# Patient Record
Sex: Female | Born: 1995 | Race: White | Hispanic: No | Marital: Single | Smoking: Never smoker
Health system: Southern US, Community
[De-identification: ages and names within clinical notes are randomized; demographics above are authoritative.]

## PROBLEM LIST (undated history)

## (undated) HISTORY — PX: ADENOIDECTOMY: SUR15

---

## 2018-05-16 ENCOUNTER — Ambulatory Visit (HOSPITAL_BASED_OUTPATIENT_CLINIC_OR_DEPARTMENT_OTHER)
Admit: 2018-05-16 | Discharge: 2018-05-16 | Disposition: A | Discharge status: Home / Self Care | Payer: Federal, State, Local not specified - PPO | Attending: Emergency Medicine | Admitting: Emergency Medicine

## 2018-05-16 ENCOUNTER — Encounter (HOSPITAL_BASED_OUTPATIENT_CLINIC_OR_DEPARTMENT_OTHER): Payer: Self-pay | Admitting: Emergency Medicine

## 2018-05-16 ENCOUNTER — Other Ambulatory Visit: Payer: Self-pay

## 2018-05-16 ENCOUNTER — Emergency Department (HOSPITAL_BASED_OUTPATIENT_CLINIC_OR_DEPARTMENT_OTHER)
Admission: EM | Admit: 2018-05-16 | Discharge: 2018-05-16 | Disposition: A | Discharge status: Home / Self Care | Payer: Federal, State, Local not specified - PPO | Attending: Emergency Medicine | Admitting: Emergency Medicine

## 2018-05-16 DIAGNOSIS — Z79899 Other long term (current) drug therapy: Secondary | ICD-10-CM | POA: Diagnosis not present

## 2018-05-16 DIAGNOSIS — R110 Nausea: Secondary | ICD-10-CM | POA: Diagnosis not present

## 2018-05-16 DIAGNOSIS — R1011 Right upper quadrant pain: Secondary | ICD-10-CM

## 2018-05-16 LAB — CBC WITH DIFFERENTIAL/PLATELET
Abs Immature Granulocytes: 0.01 10*3/uL (ref 0.00–0.07)
BASOS PCT: 1 %
Basophils Absolute: 0.1 10*3/uL (ref 0.0–0.1)
Eosinophils Absolute: 0.1 10*3/uL (ref 0.0–0.5)
Eosinophils Relative: 1 %
HCT: 43.4 % (ref 36.0–46.0)
Hemoglobin: 14.3 g/dL (ref 12.0–15.0)
Immature Granulocytes: 0 %
Lymphocytes Relative: 43 %
Lymphs Abs: 3.2 10*3/uL (ref 0.7–4.0)
MCH: 29.8 pg (ref 26.0–34.0)
MCHC: 32.9 g/dL (ref 30.0–36.0)
MCV: 90.4 fL (ref 80.0–100.0)
Monocytes Absolute: 0.7 10*3/uL (ref 0.1–1.0)
Monocytes Relative: 9 %
Neutro Abs: 3.5 10*3/uL (ref 1.7–7.7)
Neutrophils Relative %: 46 %
Platelets: 290 10*3/uL (ref 150–400)
RBC: 4.8 MIL/uL (ref 3.87–5.11)
RDW: 11.5 % (ref 11.5–15.5)
WBC: 7.4 10*3/uL (ref 4.0–10.5)
nRBC: 0 % (ref 0.0–0.2)

## 2018-05-16 LAB — COMPREHENSIVE METABOLIC PANEL
ALT: 14 U/L (ref 0–44)
AST: 17 U/L (ref 15–41)
Albumin: 4.7 g/dL (ref 3.5–5.0)
Alkaline Phosphatase: 48 U/L (ref 38–126)
Anion gap: 5 (ref 5–15)
BUN: 10 mg/dL (ref 6–20)
CO2: 26 mmol/L (ref 22–32)
Calcium: 9.2 mg/dL (ref 8.9–10.3)
Chloride: 106 mmol/L (ref 98–111)
Creatinine, Ser: 0.67 mg/dL (ref 0.44–1.00)
GFR calc Af Amer: >60 mL/min (ref >60)
GFR calc non Af Amer: >60 mL/min (ref >60)
Glucose, Bld: 99 mg/dL (ref 70–99)
POTASSIUM: 3.2 mmol/L — AB (ref 3.5–5.1)
Sodium: 137 mmol/L (ref 135–145)
Total Bilirubin: 0.6 mg/dL (ref 0.3–1.2)
Total Protein: 7.5 g/dL (ref 6.5–8.1)

## 2018-05-16 LAB — URINALYSIS, ROUTINE W REFLEX MICROSCOPIC
Bilirubin Urine: NEGATIVE
Glucose, UA: NEGATIVE mg/dL
Hgb urine dipstick: NEGATIVE
Ketones, ur: NEGATIVE mg/dL
Leukocytes,Ua: NEGATIVE
Nitrite: NEGATIVE
PROTEIN: NEGATIVE mg/dL
Specific Gravity, Urine: 1.025 (ref 1.005–1.030)
pH: 6 (ref 5.0–8.0)

## 2018-05-16 LAB — LIPASE, BLOOD: Lipase: 39 U/L (ref 11–51)

## 2018-05-16 LAB — PREGNANCY, URINE: PREG TEST UR: NEGATIVE

## 2018-05-16 MED ORDER — FENTANYL CITRATE (PF) 100 MCG/2ML IJ SOLN
100.0000 ug | Freq: Once | INTRAMUSCULAR | Status: AC
  Administered 2018-05-16: 100 ug via INTRAVENOUS
  Filled 2018-05-16: qty 2

## 2018-05-16 MED ORDER — ONDANSETRON HCL 4 MG/2ML IJ SOLN
4.0000 mg | Freq: Once | INTRAMUSCULAR | Status: AC
  Administered 2018-05-16: 4 mg via INTRAVENOUS
  Filled 2018-05-16: qty 2

## 2018-05-16 NOTE — ED Provider Notes (Signed)
[facility] Provider Note: Lowella Dell, MD, FACEP  CSN: [id number] MRN: [id number] ARRIVAL: 05/16/18 at 0144 ROOM: [facility]07   CHIEF COMPLAINT  Abdominal Pain   HISTORY OF PRESENT ILLNESS  05/16/18 1:57 AM Lauren Schmitt is a 23 y.o. female who had a sudden onset of sharp, severe right upper quadrant pain about 30 minutes prior to arrival.  The pain is worse with movement or palpation.  There is associated nausea but no vomiting or diarrhea.  She denies fever.  She has no history of similar pain.    History reviewed. No pertinent past medical history.  Past Surgical History:  Procedure Laterality Date  . ADENOIDECTOMY      Family History  Problem Relation Age of Onset  . Stroke Other     Social History   Tobacco Use  . Smoking status: Never Smoker  . Smokeless tobacco: Never Used  Substance Use Topics  . Alcohol use: Yes    Comment: rare  . Drug use: Never    Prior to Admission medications   Medication Sig Start Date End Date Taking? Authorizing Provider  levonorgestrel (MIRENA) 20 MCG/24HR IUD Mirena 20 mcg/24 hours (5 yrs) 52 mg intrauterine device  Take 1 device by intrauterine route.    [provider]    Allergies Sulfa antibiotics   REVIEW OF SYSTEMS  Negative except as noted here or in the History of Present Illness.   PHYSICAL EXAMINATION  Initial Vital Signs Blood pressure (!) 119/105, pulse (!) 103, temperature 97.9 F (36.6 C), temperature source Oral, height 5' (1.524 m), weight 49.9 kg, last menstrual period 05/16/2018, SpO2 100 %.  Examination General: Well-developed, well-nourished female in no acute distress; appearance consistent with age of record HENT: normocephalic; atraumatic Eyes: pupils equal, round and reactive to light; extraocular muscles intact Neck: supple Heart: regular rate and rhythm Lungs: clear to auscultation bilaterally Abdomen: soft; nondistended; right upper quadrant tenderness; bowel  sounds present Extremities: No deformity; full range of motion; pulses normal Neurologic: Awake, alert and oriented; motor function intact in all extremities and symmetric; no facial droop Skin: Warm and dry Psychiatric: Tearful   RESULTS  Summary of this visit's results, reviewed by myself:   EKG Interpretation  Date/Time:    Ventricular Rate:    PR Interval:    QRS Duration:   QT Interval:    QTC Calculation:   R Axis:     Text Interpretation:        Laboratory Studies: Results for orders placed or performed during the hospital encounter of 05/16/18 (from the past 24 hour(s))  Lipase, blood     Status: None   Collection Time: 05/16/18  2:09 AM  Result Value Ref Range   Lipase 39 11 - 51 U/L  Comprehensive metabolic panel     Status: Abnormal   Collection Time: 05/16/18  2:09 AM  Result Value Ref Range   Sodium 137 135 - 145 mmol/L   Potassium 3.2 (L) 3.5 - 5.1 mmol/L   Chloride 106 98 - 111 mmol/L   CO2 26 22 - 32 mmol/L   Glucose, Bld 99 70 - 99 mg/dL   BUN 10 6 - 20 mg/dL   Creatinine, Ser 0.16 0.44 - 1.00 mg/dL   Calcium 9.2 8.9 - 01.0 mg/dL   Total Protein 7.5 6.5 - 8.1 g/dL   Albumin 4.7 3.5 - 5.0 g/dL   AST 17 15 - 41 U/L   ALT 14 0 - 44 U/L   Alkaline  Phosphatase 48 38 - 126 U/L   Total Bilirubin 0.6 0.3 - 1.2 mg/dL   GFR calc non Af Amer >60 >60 mL/min   GFR calc Af Amer >60 >60 mL/min   Anion gap 5 5 - 15  CBC with Differential/Platelet     Status: None   Collection Time: 05/16/18  2:09 AM  Result Value Ref Range   WBC 7.4 4.0 - 10.5 K/uL   RBC 4.80 3.87 - 5.11 MIL/uL   Hemoglobin 14.3 12.0 - 15.0 g/dL   HCT 27.0 35.0 - 09.3 %   MCV 90.4 80.0 - 100.0 fL   MCH 29.8 26.0 - 34.0 pg   MCHC 32.9 30.0 - 36.0 g/dL   RDW 81.8 29.9 - 37.1 %   Platelets 290 150 - 400 K/uL   nRBC 0.0 0.0 - 0.2 %   Neutrophils Relative % 46 %   Neutro Abs 3.5 1.7 - 7.7 K/uL   Lymphocytes Relative 43 %   Lymphs Abs 3.2 0.7 - 4.0 K/uL   Monocytes Relative 9 %    Monocytes Absolute 0.7 0.1 - 1.0 K/uL   Eosinophils Relative 1 %   Eosinophils Absolute 0.1 0.0 - 0.5 K/uL   Basophils Relative 1 %   Basophils Absolute 0.1 0.0 - 0.1 K/uL   Immature Granulocytes 0 %   Abs Immature Granulocytes 0.01 0.00 - 0.07 K/uL  Urinalysis, Routine w reflex microscopic     Status: None   Collection Time: 05/16/18  3:29 AM  Result Value Ref Range   Color, Urine YELLOW YELLOW   APPearance CLEAR CLEAR   Specific Gravity, Urine 1.025 1.005 - 1.030   pH 6.0 5.0 - 8.0   Glucose, UA NEGATIVE NEGATIVE mg/dL   Hgb urine dipstick NEGATIVE NEGATIVE   Bilirubin Urine NEGATIVE NEGATIVE   Ketones, ur NEGATIVE NEGATIVE mg/dL   Protein, ur NEGATIVE NEGATIVE mg/dL   Nitrite NEGATIVE NEGATIVE   Leukocytes,Ua NEGATIVE NEGATIVE  Pregnancy, urine     Status: None   Collection Time: 05/16/18  3:29 AM  Result Value Ref Range   Preg Test, Ur NEGATIVE NEGATIVE   Imaging Studies: No results found.  ED COURSE and MDM  Nursing notes and initial vitals signs, including pulse oximetry, reviewed.  Vitals:   05/16/18 0151 05/16/18 0154  BP:  (!) 119/105  Pulse:  (!) 103  Temp:  97.9 F (36.6 C)  TempSrc:  Oral  SpO2:  100%  Weight: 49.9 kg   Height: 5' (1.524 m)    3:44 AM Patient pain-free at this time, abdomen soft and nontender.  Symptoms are suspicious for biliary colic.  I was unable to see the gallbladder with bedside ultrasound.  Will have her return later this morning for a formal ultrasound.  PROCEDURES    ED DIAGNOSES     ICD-10-CM   1. Right upper quadrant abdominal pain R10.11        Amorette Charrette, Jonny Ruiz, MD 05/16/18 [phone]

## 2018-05-16 NOTE — ED Triage Notes (Signed)
Pt states she was watching TV tonight and got a sharp sudden pain in her right side  Pt states the pain has gotten worse since and is now on the right side and moving toward the center   Denies N/V/D

## 2018-05-16 NOTE — ED Provider Notes (Signed)
Patient is feeling well.  She has minimal to no abdominal pain.  Informed of negative ultrasound results.   Pricilla Loveless, MD 05/16/18 [phone]

## 2020-08-10 IMAGING — US US ABDOMEN LIMITED
1 series · 14 of 25 positions shown · non-contrast
Comparison: None.

CLINICAL DATA: Right upper quadrant pain and nausea for 1 day

EXAM:
ULTRASOUND ABDOMEN LIMITED RIGHT UPPER QUADRANT

[Series 1: us abdomen limited · 0.14mm/px · 14 of 30 slices shown]
[im 1/30]
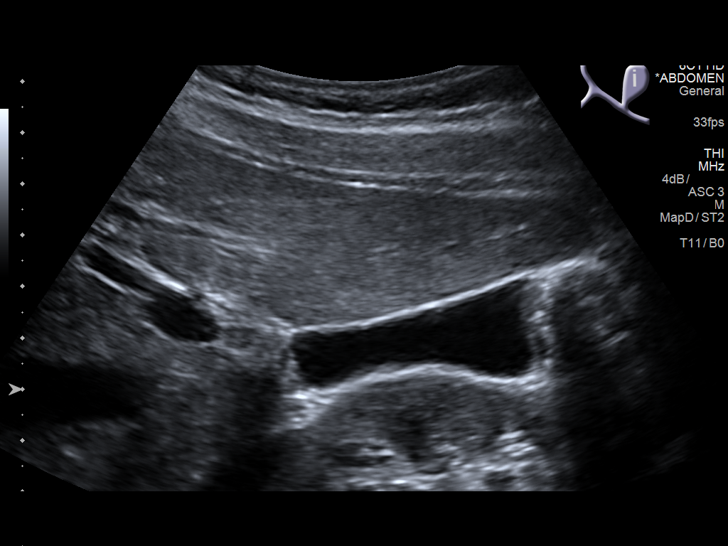
[im 3/30]
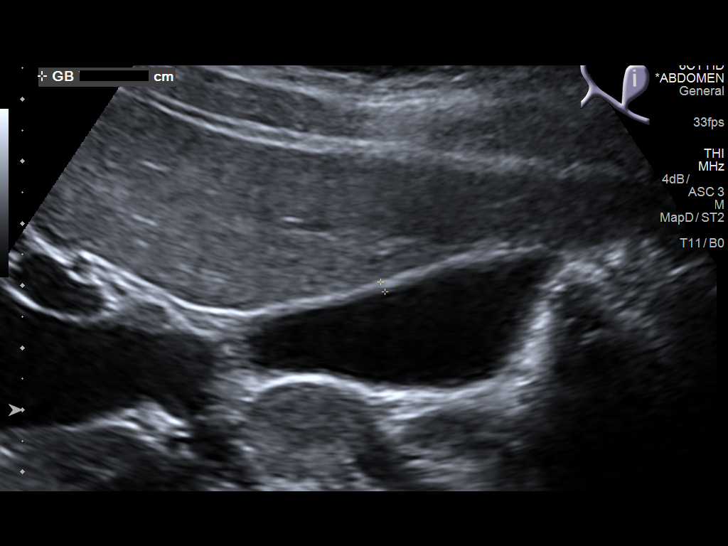
[im 5/30]
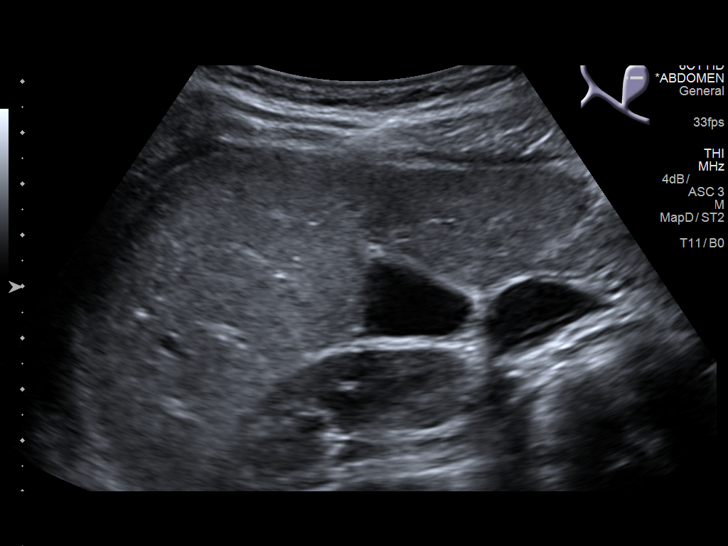
[im 8/30]
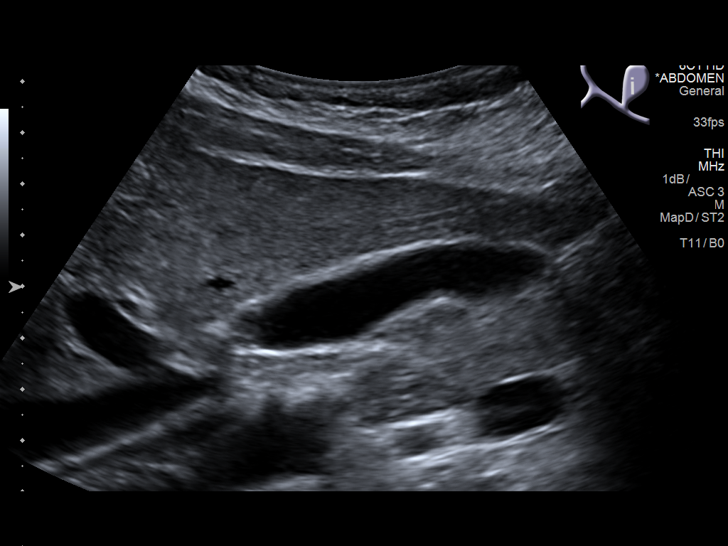
[im 10/30]
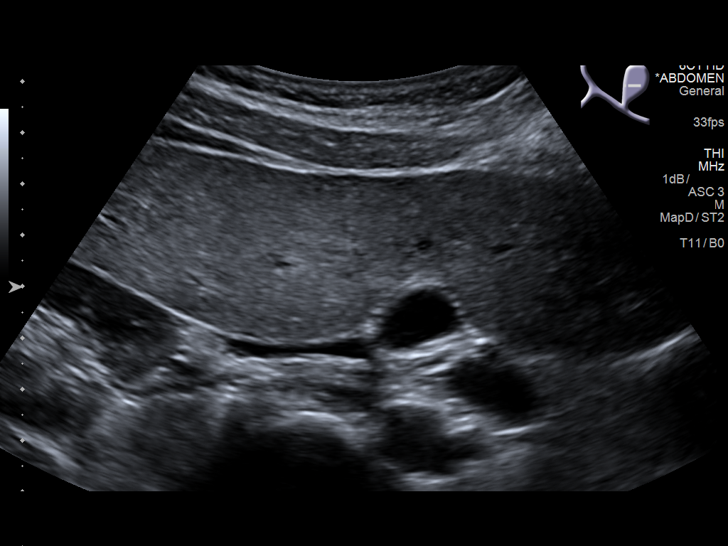
[im 11/30]
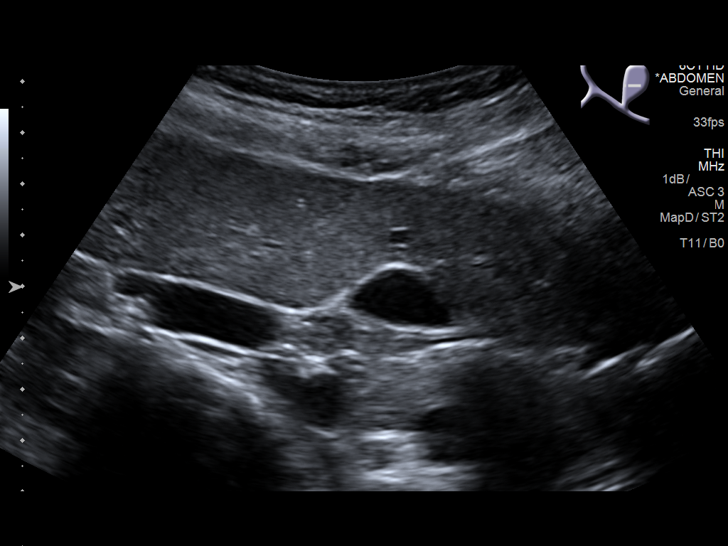
[im 14/30]
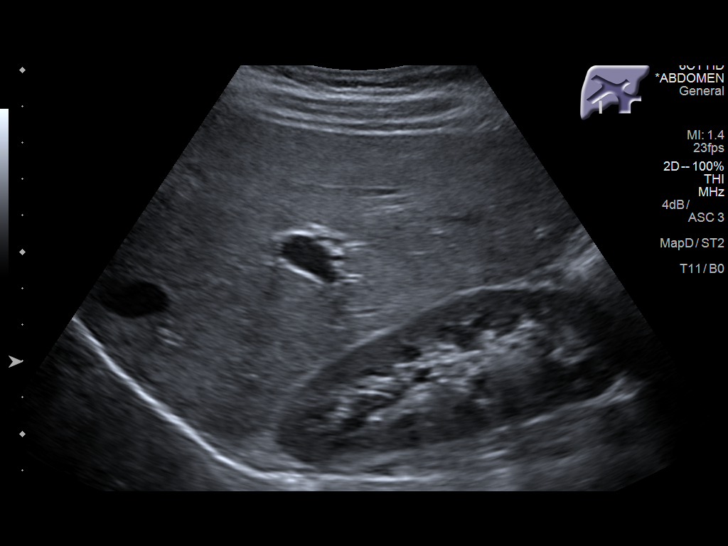
[im 16/30]
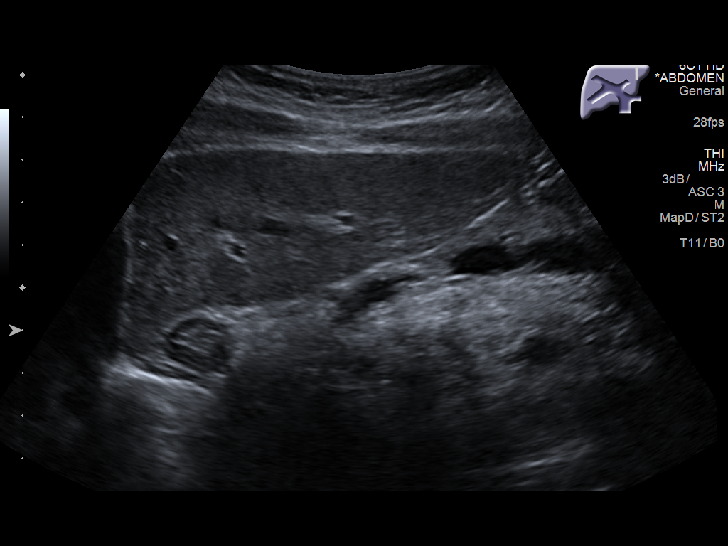
[im 19/30]
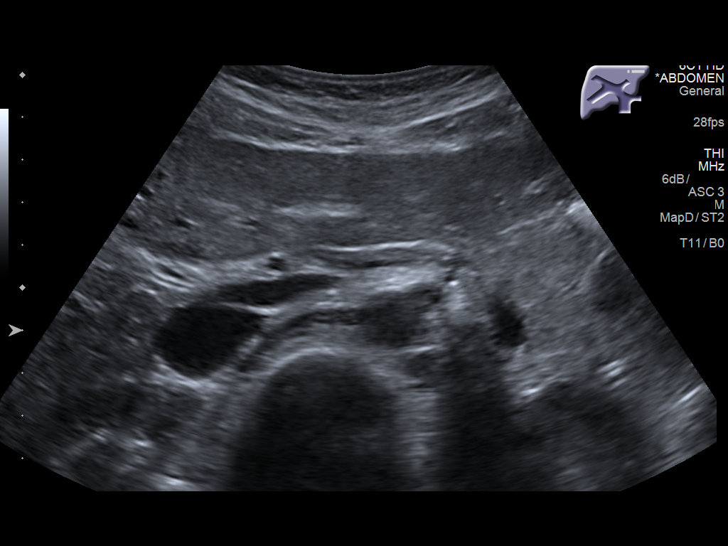
[im 20/30]
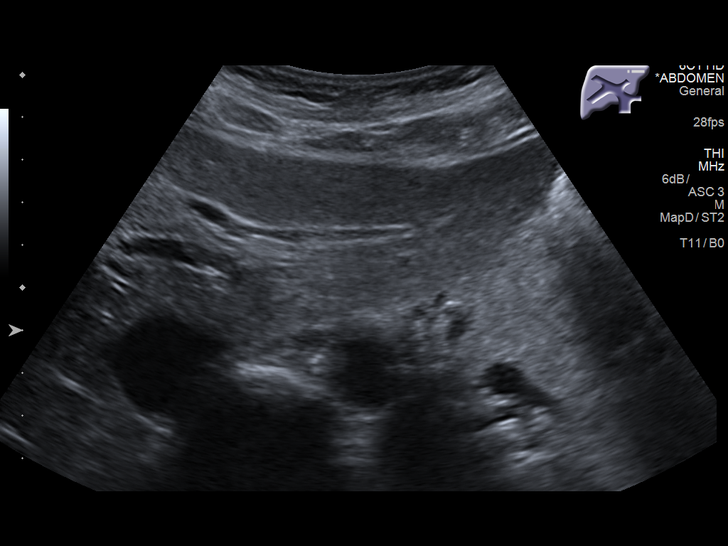
[im 22/30]
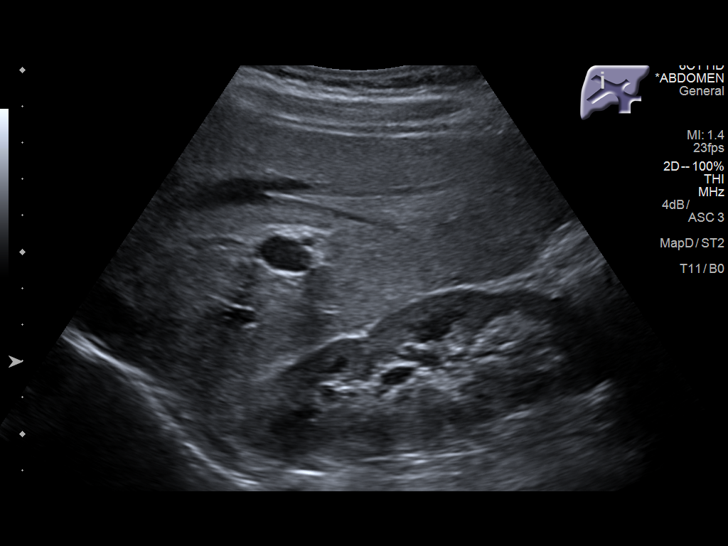
[im 25/30]
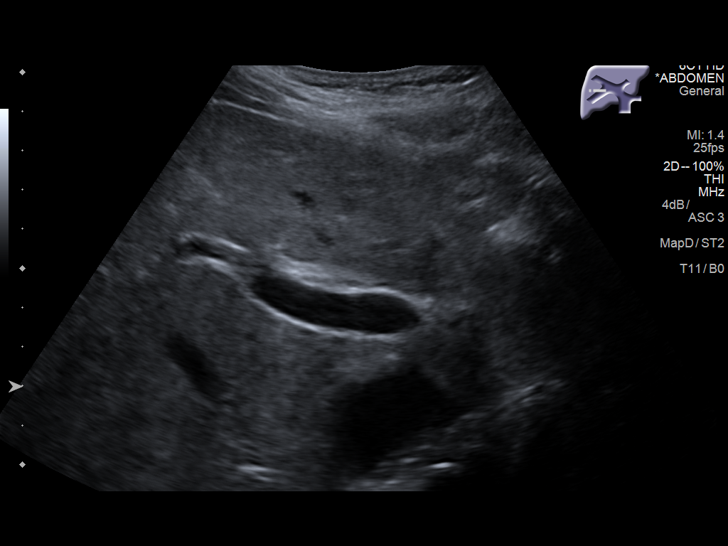
[im 27/30]
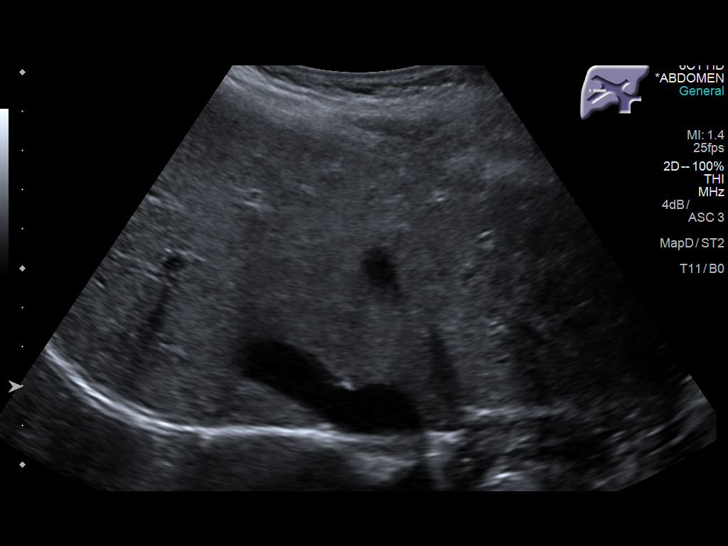
[im 30/30]
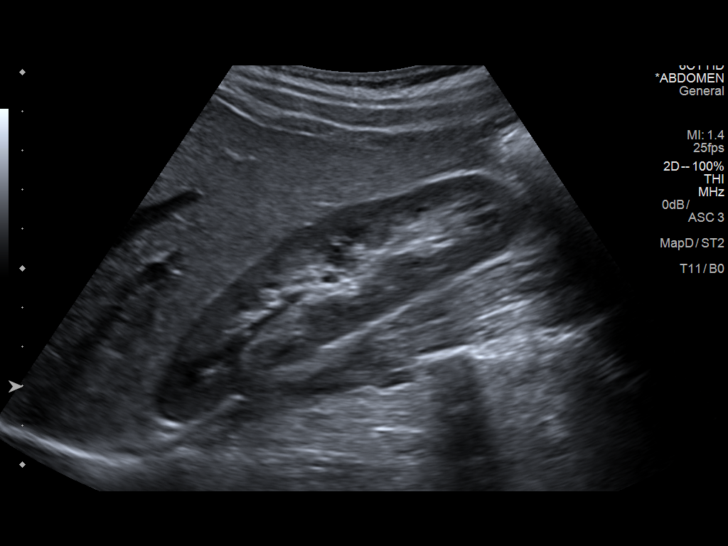

[14 of 25 positions shown; findings below may reference images not displayed]

FINDINGS: Gallbladder:

No gallstones or wall thickening visualized. No sonographic Murphy
sign noted by sonographer.

Common bile duct:

Diameter: 1 mm.

Liver:

No focal lesion identified. Within normal limits in parenchymal
echogenicity. Portal vein is patent on color Doppler imaging with
normal direction of blood flow towards the liver.
IMPRESSION: Within normal limits.
